# Patient Record
Sex: Male | Born: 1975 | Race: Black or African American | Hispanic: No | Marital: Single | Smoking: Never smoker
Health system: Southern US, Community
[De-identification: ages and names within clinical notes are randomized; demographics above are authoritative.]

## PROBLEM LIST (undated history)

## (undated) DIAGNOSIS — B019 Varicella without complication: Secondary | ICD-10-CM

## (undated) HISTORY — DX: Varicella without complication: B01.9

## (undated) HISTORY — PX: OTHER SURGICAL HISTORY: SHX169

---

## 2008-02-02 ENCOUNTER — Encounter: Admission: RE | Admit: 2008-02-02 | Discharge: 2008-02-02 | Payer: Self-pay | Admitting: Family Medicine

## 2010-12-14 ENCOUNTER — Encounter: Payer: Self-pay | Admitting: Family Medicine

## 2012-08-07 ENCOUNTER — Ambulatory Visit: Payer: Self-pay | Admitting: Emergency Medicine

## 2012-08-07 ENCOUNTER — Encounter: Payer: Self-pay | Admitting: Family Medicine

## 2012-08-07 VITALS — BP 118/72 | HR 53 | Temp 98.8°F | Resp 16 | Ht 71.75 in | Wt 162.4 lb

## 2012-08-07 DIAGNOSIS — H60339 Swimmer's ear, unspecified ear: Secondary | ICD-10-CM

## 2012-08-07 MED ORDER — CIPROFLOXACIN-HYDROCORTISONE 0.2-1 % OT SUSP: 3.0000 [drp] | Freq: Two times a day (BID) | OTIC | Status: DC

## 2012-08-07 MED ORDER — OFLOXACIN 0.3 % OT SOLN: 10.0000 [drp] | Freq: Every day | OTIC | Status: AC

## 2012-08-07 NOTE — Progress Notes (Addendum)
  Date:  08/07/2012   Name:  David Terry   DOB:  1976-04-29   MRN:  [id number] Gender: male Age: 36 y.o.  PCP:  No primary provider on file.    Chief Complaint: Otalgia   History of Present Illness:  David Terry is a 36 y.o. pleasant patient who presents with the following:  Pain in right ear for 3 days.  Thought that it might be wax and did a treatment.  Now his ear is painful and popping a good bit.  No drainage from ear.  No fever or chills, no nasal congestion, sore throat, sinus drainage.  No neurologic symptoms.  No headache, neck pain.  No cough or coryza.  There is no problem list on file for this patient.   No past medical history on file.  No past surgical history on file.  History  Substance Use Topics  . Smoking status: Never Smoker   . Smokeless tobacco: Not on file  . Alcohol Use: Not on file    No family history on file.  No Known Allergies  Medication list has been reviewed and updated.  No outpatient prescriptions prior to visit.    Review of Systems:  As per HPI, otherwise negative.     Physical Examination: Filed Vitals:   08/07/12 1234  BP: 118/72  Pulse: 53  Temp: 98.8 F (37.1 C)  Resp: 16   Filed Vitals:   08/07/12 1234  Height: 5' 11.75" (1.822 m)  Weight: 162 lb 6.4 oz (73.664 kg)   Body mass index is 22.18 kg/(m^2). Ideal Body Weight: Weight in (lb) to have BMI = 25: 182.7    GEN: WDWN, NAD, Non-toxic, Alert & Oriented x 3 HEENT: Atraumatic, Normocephalic.  Ears and Nose: No external deformity.  Left ear normal.  Right external canal closed nearly completely due swelling. EXTR: No clubbing/cyanosis/edema NEURO: Normal gait.  PSYCH: Normally interactive. Conversant. Not depressed or anxious appearing.  Calm demeanor.    Assessment and Plan: Otitis externa cipro hc Follow up as needed. Unable to pass a wick  Carmelina Dane, MD I have reviewed and agree with documentation. Robert P. Merla Riches,  M.D.

## 2013-05-15 ENCOUNTER — Ambulatory Visit (INDEPENDENT_AMBULATORY_CARE_PROVIDER_SITE_OTHER): Payer: No Typology Code available for payment source | Admitting: Family Medicine

## 2013-05-15 ENCOUNTER — Encounter: Payer: Self-pay | Admitting: Family Medicine

## 2013-05-15 VITALS — BP 110/72 | HR 56 | Temp 98.6°F | Ht 72.5 in | Wt 168.6 lb

## 2013-05-15 DIAGNOSIS — Z23 Encounter for immunization: Secondary | ICD-10-CM

## 2013-05-15 DIAGNOSIS — Z0000 Encounter for general adult medical examination without abnormal findings: Secondary | ICD-10-CM

## 2013-05-15 NOTE — Progress Notes (Signed)
  Subjective:    Patient ID: David Terry, male    DOB: 03/13/1976, 37 y.o.   MRN: [id number]  HPI Pt here for cpe and will need to rto for labs.  No complaints.   Review of Systems Review of Systems  Constitutional: Negative for activity change, appetite change and fatigue.  HENT: Negative for hearing loss, congestion, tinnitus and ear discharge.  dentist q31m Eyes: Negative for visual disturbance (see optho--due) Respiratory: Negative for cough, chest tightness and shortness of breath.   Cardiovascular: Negative for chest pain, palpitations and leg swelling.  Gastrointestinal: Negative for abdominal pain, diarrhea, constipation and abdominal distention.  Genitourinary: Negative for urgency, frequency, decreased urine volume and difficulty urinating.  Musculoskeletal: Negative for back pain, arthralgias and gait problem.  Skin: Negative for color change, pallor and rash.  Neurological: Negative for dizziness, light-headedness, numbness and headaches.  Hematological: Negative for adenopathy. Does not bruise/bleed easily.  Psychiatric/Behavioral: Negative for suicidal ideas, confusion, sleep disturbance, self-injury, dysphoric mood, decreased concentration and agitation.    Past Medical History  Diagnosis Date  . Chickenpox    History   Social History  . Marital Status: Single    Spouse Name: N/A    Number of Children: N/A  . Years of Education: N/A   Occupational History  . indp contractor    Social History Main Topics  . Smoking status: Never Smoker   . Smokeless tobacco: Never Used  . Alcohol Use: Yes     Comment: Occ  . Drug Use: No  . Sexually Active: Yes -- Male partner(s)   Other Topics Concern  . Not on file   Social History Narrative   Exercise-- run--3x a week        Objective:   Physical Exam BP 110/72  Pulse 56  Temp(Src) 98.6 F (37 C) (Oral)  Ht 6' 0.5" (1.842 m)  Wt 168 lb 9.6 oz (76.476 kg)  BMI 22.54 kg/m2  SpO2 98%    BP 110/72   Pulse 56  Temp(Src) 98.6 F (37 C) (Oral)  Ht 6' 0.5" (1.842 m)  Wt 168 lb 9.6 oz (76.476 kg)  BMI 22.54 kg/m2  SpO2 98% General appearance: alert, cooperative, appears stated age and no distress Head: Normocephalic, without obvious abnormality, atraumatic Eyes: conjunctivae/corneas clear. PERRL, EOM's intact. Fundi benign. Ears: normal TM's and external ear canals both ears Nose: Nares normal. Septum midline. Mucosa normal. No drainage or sinus tenderness. Throat: lips, mucosa, and tongue normal; teeth and gums normal Neck: no adenopathy, no carotid bruit, no JVD, supple, symmetrical, trachea midline and thyroid not enlarged, symmetric, no tenderness/mass/nodules Back: symmetric, no curvature. ROM normal. No CVA tenderness. Lungs: clear to auscultation bilaterally Chest wall: no tenderness Heart: regular rate and rhythm, S1, S2 normal, no murmur, click, rub or gallop Abdomen: soft, non-tender; bowel sounds normal; no masses,  no organomegaly Male genitalia: normal Rectal: deferred Extremities: extremities normal, atraumatic, no cyanosis or edema Pulses: 2+ and symmetric Skin: Skin color, texture, turgor normal. No rashes or lesions Lymph nodes: Cervical, supraclavicular, and axillary nodes normal. Neurologic: Alert and oriented X 3, normal strength and tone. Normal symmetric reflexes. Normal coordination and gait Assessment & Plan:  cpe --- ghm utd             Check labs             Pt requesting STD check be done----he is not complaining of any signs or symptoms

## 2013-05-15 NOTE — Patient Instructions (Signed)
Preventive Care for Adults, Male  A healthy lifestyle and preventive care can promote health and wellness. Preventive health guidelines for men include the following key practices:  · A routine yearly physical is a good way to check with your caregiver about your health and preventative screening. It is a chance to share any concerns and updates on your health, and to receive a thorough exam.  · Visit your dentist for a routine exam and preventative care every 6 months. Brush your teeth twice a day and floss once a day. Good oral hygiene prevents tooth decay and gum disease.  · The frequency of eye exams is based on your age, health, family medical history, use of contact lenses, and other factors. Follow your caregiver's recommendations for frequency of eye exams.  · Eat a healthy diet. Foods like vegetables, fruits, whole grains, low-fat dairy products, and lean protein foods contain the nutrients you need without too many calories. Decrease your intake of foods high in solid fats, added sugars, and salt. Eat the right amount of calories for you. Get information about a proper diet from your caregiver, if necessary.  · Regular physical exercise is one of the most important things you can do for your health. Most adults should get at least 150 minutes of moderate-intensity exercise (any activity that increases your heart rate and causes you to sweat) each week. In addition, most adults need muscle-strengthening exercises on 2 or more days a week.  · Maintain a healthy weight. The body mass index (BMI) is a screening tool to identify possible weight problems. It provides an estimate of body fat based on height and weight. Your caregiver can help determine your BMI, and can help you achieve or maintain a healthy weight. For adults 20 years and older:  · A BMI below 18.5 is considered underweight.  · A BMI of 18.5 to 24.9 is normal.  · A BMI of 25 to 29.9 is considered overweight.  · A BMI of 30 and above is  considered obese.  · Maintain normal blood lipids and cholesterol levels by exercising and minimizing your intake of saturated fat. Eat a balanced diet with plenty of fruit and vegetables. Blood tests for lipids and cholesterol should begin at age 20 and be repeated every 5 years. If your lipid or cholesterol levels are high, you are over 50, or you are a high risk for heart disease, you may need your cholesterol levels checked more frequently. Ongoing high lipid and cholesterol levels should be treated with medicines if diet and exercise are not effective.  · If you smoke, find out from your caregiver how to quit. If you do not use tobacco, do not start.  · If you choose to drink alcohol, do not exceed 2 drinks per day. One drink is considered to be 12 ounces (355 mL) of beer, 5 ounces (148 mL) of wine, or 1.5 ounces (44 mL) of liquor.  · Avoid use of street drugs. Do not share needles with anyone. Ask for help if you need support or instructions about stopping the use of drugs.  · High blood pressure causes heart disease and increases the risk of stroke. Your blood pressure should be checked at least every 1 to 2 years. Ongoing high blood pressure should be treated with medicines, if weight loss and exercise are not effective.  · If you are 45 to 37 years old, ask your caregiver if you should take aspirin to prevent heart disease.  · Diabetes screening involves taking   a blood sample to check your fasting blood sugar level. This should be done once every 3 years, after age 45, if you are within normal weight and without risk factors for diabetes. Testing should be considered at a younger age or be carried out more frequently if you are overweight and have at least 1 risk factor for diabetes.  · Colorectal cancer can be detected and often prevented. Most routine colorectal cancer screening begins at the age of 50 and continues through age 75. However, your caregiver may recommend screening at an earlier age if you  have risk factors for colon cancer. On a yearly basis, your caregiver may provide home test kits to check for hidden blood in the stool. Use of a small camera at the end of a tube, to directly examine the colon (sigmoidoscopy or colonoscopy), can detect the earliest forms of colorectal cancer. Talk to your caregiver about this at age 50, when routine screening begins.  Direct examination of the colon should be repeated every 5 to 10 years through age 75, unless early forms of pre-cancerous polyps or small growths are found.  · Hepatitis C blood testing is recommended for all people born from 1945 through 1965 and any individual with known risks for hepatitis C.  · Practice safe sex. Use condoms and avoid high-risk sexual practices to reduce the spread of sexually transmitted infections (STIs). STIs include gonorrhea, chlamydia, syphilis, trichomonas, herpes, HPV, and human immunodeficiency virus (HIV). Herpes, HIV, and HPV are viral illnesses that have no cure. They can result in disability, cancer, and death.  · A one-time screening for abdominal aortic aneurysm (AAA) and surgical repair of large AAAs by sound wave imaging (ultrasonography) is recommended for ages 65 to 75 years who are current or former smokers.  · Healthy men should no longer receive prostate-specific antigen (PSA) blood tests as part of routine cancer screening. Consult with your caregiver about prostate cancer screening.  · Testicular cancer screening is not recommended for adult males who have no symptoms. Screening includes self-exam, caregiver exam, and other screening tests. Consult with your caregiver about any symptoms you have or any concerns you have about testicular cancer.  · Use sunscreen with skin protection factor (SPF) of 30 or more. Apply sunscreen liberally and repeatedly throughout the day. You should seek shade when your shadow is shorter than you. Protect yourself by wearing long sleeves, pants, a wide-brimmed hat, and  sunglasses year round, whenever you are outdoors.  · Once a month, do a whole body skin exam, using a mirror to look at the skin on your back. Notify your caregiver of new moles, moles that have irregular borders, moles that are larger than a pencil eraser, or moles that have changed in shape or color.  · Stay current with required immunizations.  · Influenza. You need a dose every fall (or winter). The composition of the flu vaccine changes each year, so being vaccinated once is not enough.  · Pneumococcal polysaccharide. You need 1 to 2 doses if you smoke cigarettes or if you have certain chronic medical conditions. You need 1 dose at age 65 (or older) if you have never been vaccinated.  · Tetanus, diphtheria, pertussis (Tdap, Td). Get 1 dose of Tdap vaccine if you are younger than age 65 years, are over 65 and have contact with an infant, are a healthcare worker, or simply want to be protected from whooping cough. After that, you need a Td booster dose every 10 years. Consult your   caregiver if you have not had at least 3 tetanus and diphtheria-containing shots sometime in your life or have a deep or dirty wound.  · HPV. This vaccine is recommended for males 13 through 37 years of age. This vaccine may be given to men 22 through 37 years of age who have not completed the 3 dose series. It is recommended for men through age 26 who have sex with men or whose immune system is weakened because of HIV infection, other illness, or medications. The vaccine is given in 3 doses over 6 months.  · Measles, mumps, rubella (MMR). You need at least 1 dose of MMR if you were born in 1957 or later. You may also need a 2nd dose.  · Meningococcal. If you are age 19 to 21 years and a first-year college student living in a residence hall, or have one of several medical conditions, you need to get vaccinated against meningococcal disease. You may also need additional booster doses.  · Zoster (shingles). If you are age 60 years or  older, you should get this vaccine.  · Varicella (chickenpox). If you have never had chickenpox or you were vaccinated but received only 1 dose, talk to your caregiver to find out if you need this vaccine.  · Hepatitis A. You need this vaccine if you have a specific risk factor for hepatitis A virus infection, or you simply wish to be protected from this disease. The vaccine is usually given as 2 doses, 6 to 18 months apart.  · Hepatitis B. You need this vaccine if you have a specific risk factor for hepatitis B virus infection or you simply wish to be protected from this disease. The vaccine is given in 3 doses, usually over 6 months.  Preventative Service / Frequency  Ages 19 to 39  · Blood pressure check.** / Every 1 to 2 years.  · Lipid and cholesterol check.** / Every 5 years beginning at age 20.  · Hepatitis C blood test.** / For any individual with known risks for hepatitis C.  · Skin self-exam. / Monthly.  · Influenza immunization.** / Every year.  · Pneumococcal polysaccharide immunization.** / 1 to 2 doses if you smoke cigarettes or if you have certain chronic medical conditions.  · Tetanus, diphtheria, pertussis (Tdap,Td) immunization. / A one-time dose of Tdap vaccine. After that, you need a Td booster dose every 10 years.  · HPV immunization. / 3 doses over 6 months, if 26 and younger.  · Measles, mumps, rubella (MMR) immunization. / You need at least 1 dose of MMR if you were born in 1957 or later. You may also need a 2nd dose.  · Meningococcal immunization. / 1 dose if you are age 19 to 21 years and a first-year college student living in a residence hall, or have one of several medical conditions, you need to get vaccinated against meningococcal disease. You may also need additional booster doses.  · Varicella immunization.** / Consult your caregiver.  · Hepatitis A immunization.** / Consult your caregiver. 2 doses, 6 to 18 months apart.  · Hepatitis B immunization.** / Consult your caregiver. 3 doses  usually over 6 months.  Ages 40 to 64  · Blood pressure check.** / Every 1 to 2 years.  · Lipid and cholesterol check.** / Every 5 years beginning at age 20.  · Fecal occult blood test (FOBT) of stool. / Every year beginning at age 50 and continuing until age 75. You may not have   to do this test if you get colonoscopy every 10 years.  · Flexible sigmoidoscopy** or colonoscopy.** / Every 5 years for a flexible sigmoidoscopy or every 10 years for a colonoscopy beginning at age 50 and continuing until age 75.  · Hepatitis C blood test.** / For all people born from 1945 through 1965 and any individual with known risks for hepatitis C.  · Skin self-exam. / Monthly.  · Influenza immunization.** / Every year.  · Pneumococcal polysaccharide immunization.** / 1 to 2 doses if you smoke cigarettes or if you have certain chronic medical conditions.  · Tetanus, diphtheria, pertussis (Tdap/Td) immunization.** / A one-time dose of Tdap vaccine. After that, you need a Td booster dose every 10 years.  · Measles, mumps, rubella (MMR) immunization.  / You need at least 1 dose of MMR if you were born in 1957 or later. You may also need a 2nd dose.  · Varicella immunization.**/ Consult your caregiver.  · Meningococcal immunization.** / Consult your caregiver.  · Hepatitis A immunization.** / Consult your caregiver. 2 doses, 6 to 18 months apart.  · Hepatitis B immunization.** / Consult your caregiver. 3 doses, usually over 6 months.  Ages 65 and over  · Blood pressure check.** / Every 1 to 2 years.  · Lipid and cholesterol check.**/ Every 5 years beginning at age 20.  · Fecal occult blood test (FOBT) of stool. / Every year beginning at age 50 and continuing until age 75. You may not have to do this test if you get colonoscopy every 10 years.  · Flexible sigmoidoscopy** or colonoscopy.** / Every 5 years for a flexible sigmoidoscopy or every 10 years for a colonoscopy beginning at age 50 and continuing until age 75.  · Hepatitis C blood  test.** / For all people born from 1945 through 1965 and any individual with known risks for hepatitis C.  · Abdominal aortic aneurysm (AAA) screening.** / A one-time screening for ages 65 to 75 years who are current or former smokers.  · Skin self-exam. / Monthly.  · Influenza immunization.** / Every year.  · Pneumococcal polysaccharide immunization.** / 1 dose at age 65 (or older) if you have never been vaccinated.  · Tetanus, diphtheria, pertussis (Tdap, Td) immunization. / A one-time dose of Tdap vaccine if you are over 65 and have contact with an infant, are a healthcare worker, or simply want to be protected from whooping cough. After that, you need a Td booster dose every 10 years.  · Varicella immunization. ** / Consult your caregiver.  · Meningococcal immunization.** / Consult your caregiver.  · Hepatitis A immunization. ** / Consult your caregiver. 2 doses, 6 to 18 months apart.  · Hepatitis B immunization.** / Check with your caregiver. 3 doses, usually over 6 months.  **Family history and personal history of risk and conditions may change your caregiver's recommendations.  Document Released: 01/05/2002 Document Revised: 02/01/2012 Document Reviewed: 04/06/2011  [employer]® Patient Information ©2014 [employer].

## 2013-05-16 ENCOUNTER — Other Ambulatory Visit (INDEPENDENT_AMBULATORY_CARE_PROVIDER_SITE_OTHER): Payer: No Typology Code available for payment source

## 2013-05-16 DIAGNOSIS — Z0000 Encounter for general adult medical examination without abnormal findings: Secondary | ICD-10-CM

## 2013-05-16 DIAGNOSIS — R809 Proteinuria, unspecified: Secondary | ICD-10-CM

## 2013-05-16 LAB — BASIC METABOLIC PANEL
CO2: 28 mEq/L (ref 19–32)
Chloride: 104 mEq/L (ref 96–112)
Creatinine, Ser: 1 mg/dL (ref 0.4–1.5)
Potassium: 3.7 mEq/L (ref 3.5–5.1)

## 2013-05-16 LAB — LIPID PANEL
LDL Cholesterol: 108 mg/dL — ABNORMAL HIGH (ref 0–99)
Total CHOL/HDL Ratio: 3
Triglycerides: 74 mg/dL (ref 0.0–149.0)
VLDL: 14.8 mg/dL (ref 0.0–40.0)

## 2013-05-16 LAB — CBC WITH DIFFERENTIAL/PLATELET
Basophils Relative: 0.3 % (ref 0.0–3.0)
Eosinophils Absolute: 0.1 10*3/uL (ref 0.0–0.7)
Eosinophils Relative: 1.7 % (ref 0.0–5.0)
HCT: 42.3 % (ref 39.0–52.0)
Hemoglobin: 14.1 g/dL (ref 13.0–17.0)
Lymphs Abs: 2 10*3/uL (ref 0.7–4.0)
MCHC: 33.5 g/dL (ref 30.0–36.0)
MCV: 91.3 fl (ref 78.0–100.0)
Monocytes Absolute: 0.6 10*3/uL (ref 0.1–1.0)
Neutro Abs: 3.4 10*3/uL (ref 1.4–7.7)
Neutrophils Relative %: 55.4 % (ref 43.0–77.0)
RBC: 4.63 Mil/uL (ref 4.22–5.81)
WBC: 6.1 10*3/uL (ref 4.5–10.5)

## 2013-05-16 LAB — HEPATIC FUNCTION PANEL
Albumin: 4.3 g/dL (ref 3.5–5.2)
Alkaline Phosphatase: 65 U/L (ref 39–117)

## 2013-06-01 LAB — PROTEIN, URINE, 24 HOUR: Protein, 24H Urine: 50 mg/d (ref 50–100)

## 2015-02-27 ENCOUNTER — Ambulatory Visit (INDEPENDENT_AMBULATORY_CARE_PROVIDER_SITE_OTHER): Payer: 59

## 2015-02-27 ENCOUNTER — Ambulatory Visit (INDEPENDENT_AMBULATORY_CARE_PROVIDER_SITE_OTHER): Payer: 59 | Admitting: Family Medicine

## 2015-02-27 VITALS — BP 106/76 | HR 57 | Temp 98.6°F | Resp 18 | Ht 72.0 in | Wt 168.2 lb

## 2015-02-27 DIAGNOSIS — R0989 Other specified symptoms and signs involving the circulatory and respiratory systems: Secondary | ICD-10-CM | POA: Diagnosis not present

## 2015-02-27 DIAGNOSIS — R059 Cough, unspecified: Secondary | ICD-10-CM

## 2015-02-27 DIAGNOSIS — R05 Cough: Secondary | ICD-10-CM

## 2015-02-27 DIAGNOSIS — J209 Acute bronchitis, unspecified: Secondary | ICD-10-CM | POA: Diagnosis not present

## 2015-02-27 LAB — POCT CBC
Granulocyte percent: 75.8 %G (ref 37–80)
HCT, POC: 42.8 % — AB (ref 43.5–53.7)
Hemoglobin: 14 g/dL — AB (ref 14.1–18.1)
Lymph, poc: 1.5 (ref 0.6–3.4)
MCH, POC: 28.6 pg (ref 27–31.2)
MCHC: 32.6 g/dL (ref 31.8–35.4)
MCV: 87.8 fL (ref 80–97)
MID (cbc): 0.4 (ref 0–0.9)
MPV: 6.8 fL (ref 0–99.8)
POC Granulocyte: 6 (ref 2–6.9)
POC LYMPH PERCENT: 18.7 % (ref 10–50)
POC MID %: 5.5 % (ref 0–12)
Platelet Count, POC: 145 10*3/uL (ref 142–424)
RBC: 4.88 M/uL (ref 4.69–6.13)
RDW, POC: 13 %
WBC: 7.9 10*3/uL (ref 4.6–10.2)

## 2015-02-27 MED ORDER — BENZONATATE 100 MG PO CAPS: 200.0000 mg | ORAL_CAPSULE | Freq: Two times a day (BID) | ORAL | Status: DC | PRN

## 2015-02-27 MED ORDER — HYDROCODONE-HOMATROPINE 5-1.5 MG/5ML PO SYRP: 5.0000 mL | ORAL_SOLUTION | Freq: Every evening | ORAL | Status: DC | PRN

## 2015-02-27 MED ORDER — AZITHROMYCIN 250 MG PO TABS: ORAL_TABLET | ORAL | Status: DC

## 2015-02-27 NOTE — Progress Notes (Signed)
Chief Complaint:  Chief Complaint  Patient presents with  . Cough    X 4-5 days    HPI: David Terry is a 39 y.o. male who is here for 4-5 day history of productive cough, chest congestion and tightness. He did not have the flu vaccine this should. He did have chills and a fever of 102 yesterday. Has taken Mucinex for it. He wants to make sure he doesn't have anything in his chest. He felt like he had flulike symptoms with muscle aches, soreness. Denies any shortness of breath. Denies wheezing. No history of asthma or allergies. She was he was recently traveling and may have caught it when he was in [place]  Past Medical History  Diagnosis Date  . Chickenpox    Past Surgical History  Procedure Laterality Date  . Undescended testi Right     39 yrs old   History   Social History  . Marital Status: Single    Spouse Name: N/A  . Number of Children: N/A  . Years of Education: N/A   Occupational History  . indp contractor    Social History Main Topics  . Smoking status: Never Smoker   . Smokeless tobacco: Never Used  . Alcohol Use: Yes     Comment: Occ  . Drug Use: No  . Sexual Activity:    Partners: Female   Other Topics Concern  . None   Social History Narrative   Exercise-- run--3x a week   History reviewed. No pertinent family history. No Known Allergies Prior to Admission medications   Not on File     ROS: The patient denies fevers, chills, night sweats, unintentional weight loss, chest pain, palpitations, wheezing, dyspnea on exertion, nausea, vomiting, abdominal pain, dysuria, hematuria, melena, numbness, weakness, or tingling.   All other systems have been reviewed and were otherwise negative with the exception of those mentioned in the HPI and as above.    PHYSICAL EXAM: Filed Vitals:   02/27/15 1719  BP: 106/76  Pulse: 57  Temp: 98.6 F (37 C)  Resp: 18   Filed Vitals:   02/27/15 1719  Height: 6' (1.829 m)  Weight: 168 lb 3.2 oz  (76.295 kg)   Body mass index is 22.81 kg/(m^2).  General: Alert, no acute distress HEENT:  Normocephalic, atraumatic, oropharynx patent. EOMI, PERRLA Erythematous throat, no exudates, TM normal, +/- sinus tenderness, + erythematous/boggy nasal mucosa Cardiovascular:  Regular rate and rhythm, no rubs murmurs or gallops.  No Carotid bruits, radial pulse intact. No pedal edema.  Respiratory: Clear to auscultation bilaterally.  No wheezes, rales, or rhonchi.  No cyanosis, no use of accessory musculature GI: No organomegaly, abdomen is soft and non-tender, positive bowel sounds.  No masses. Skin: No rashes. Neurologic: Facial musculature symmetric. Psychiatric: Patient is appropriate throughout our interaction. Lymphatic: No cervical lymphadenopathy Musculoskeletal: Gait intact.   LABS: Results for orders placed or performed in visit on 02/27/15  POCT CBC  Result Value Ref Range   WBC 7.9 4.6 - 10.2 K/uL   Lymph, poc 1.5 0.6 - 3.4   POC LYMPH PERCENT 18.7 10 - 50 %L   MID (cbc) 0.4 0 - 0.9   POC MID % 5.5 0 - 12 %M   POC Granulocyte 6.0 2 - 6.9   Granulocyte percent 75.8 37 - 80 %G   RBC 4.88 4.69 - 6.13 M/uL   Hemoglobin 14.0 (A) 14.1 - 18.1 g/dL   HCT, POC 16.1 (A) 09.6 -  53.7 %   MCV 87.8 80 - 97 fL   MCH, POC 28.6 27 - 31.2 pg   MCHC 32.6 31.8 - 35.4 g/dL   RDW, POC 01.013.0 %   Platelet Count, POC 145 142 - 424 K/uL   MPV 6.8 0 - 99.8 fL     EKG/XRAY:   [facility] Neg xray   ASSESSMENT/PLAN: Encounter Diagnoses  Name Primary?  . Cough Yes  . Chest congestion   . Acute bronchitis, unspecified organism    We will treat for cough with Tessalon Perles and also Hycodan when necessary If he is not improved and he can take the Z-Pak for acute bronchitis. He declines any inhalers at this time. Follow up as needed  Gross sideeffects, risk and benefits, and alternatives of medications d/w patient. Patient is aware that all medications have  potential sideeffects and we are unable to predict every sideeffect or drug-drug interaction that may occur.  Hamilton CapriLE, [name], DO 02/27/2015 7:27 PM

## 2015-02-27 NOTE — Patient Instructions (Addendum)
Acute Bronchitis Bronchitis is inflammation of the airways that extend from the windpipe into the lungs (bronchi). The inflammation often causes mucus to develop. This leads to a cough, which is the most common symptom of bronchitis.  In acute bronchitis, the condition usually develops suddenly and goes away over time, usually in a couple weeks. Smoking, allergies, and asthma can make bronchitis worse. Repeated episodes of bronchitis may cause further lung problems.  CAUSES Acute bronchitis is most often caused by the same virus that causes a cold. The virus can spread from person to person (contagious) through coughing, sneezing, and touching contaminated objects. SIGNS AND SYMPTOMS   Cough.   Fever.   Coughing up mucus.   Body aches.   Chest congestion.   Chills.   Shortness of breath.   Sore throat.  DIAGNOSIS  Acute bronchitis is usually diagnosed through a physical exam. Your health care provider will also ask you questions about your medical history. Tests, such as chest X-rays, are sometimes done to rule out other conditions.  TREATMENT  Acute bronchitis usually goes away in a couple weeks. Oftentimes, no medical treatment is necessary. Medicines are sometimes given for relief of fever or cough. Antibiotic medicines are usually not needed but may be prescribed in certain situations. In some cases, an inhaler may be recommended to help reduce shortness of breath and control the cough. A cool mist vaporizer may also be used to help thin bronchial secretions and make it easier to clear the chest.  HOME CARE INSTRUCTIONS  Get plenty of rest.   Drink enough fluids to keep your urine clear or pale yellow (unless you have a medical condition that requires fluid restriction). Increasing fluids may help thin your respiratory secretions (sputum) and reduce chest congestion, and it will prevent dehydration.   Take medicines only as directed by your health care provider.  If  you were prescribed an antibiotic medicine, finish it all even if you start to feel better.  Avoid smoking and secondhand smoke. Exposure to cigarette smoke or irritating chemicals will make bronchitis worse. If you are a smoker, consider using nicotine gum or skin patches to help control withdrawal symptoms. Quitting smoking will help your lungs heal faster.   Reduce the chances of another bout of acute bronchitis by washing your hands frequently, avoiding people with cold symptoms, and trying not to touch your hands to your mouth, nose, or eyes.   Keep all follow-up visits as directed by your health care provider.  SEEK MEDICAL CARE IF: Your symptoms do not improve after 1 week of treatment.  SEEK IMMEDIATE MEDICAL CARE IF:  You develop an increased fever or chills.   You have chest pain.   You have severe shortness of breath.  You have bloody sputum.   You develop dehydration.  You faint or repeatedly feel like you are going to pass out.  You develop repeated vomiting.  You develop a severe headache. MAKE SURE YOU:   Understand these instructions.  Will watch your condition.  Will get help right away if you are not doing well or get worse. Document Released: 12/17/2004 Document Revised: 03/26/2014 Document Reviewed: 05/02/2013 [facility] Patient Information 2015 [place], [place]. This information is not intended to replace advice given to you by your health care provider. Make sure you discuss any questions you have with your health care provider.   Nasacort nasal spray OTC once daily

## 2016-01-18 ENCOUNTER — Ambulatory Visit (INDEPENDENT_AMBULATORY_CARE_PROVIDER_SITE_OTHER): Payer: BLUE CROSS/BLUE SHIELD | Admitting: Family Medicine

## 2016-01-18 DIAGNOSIS — M6248 Contracture of muscle, other site: Secondary | ICD-10-CM

## 2016-01-18 DIAGNOSIS — S134XXA Sprain of ligaments of cervical spine, initial encounter: Secondary | ICD-10-CM

## 2016-01-18 DIAGNOSIS — M62838 Other muscle spasm: Secondary | ICD-10-CM
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
## 2016-01-18 NOTE — Patient Instructions (Signed)
Cervical Sprain  A cervical sprain is an injury in the neck in which the strong, fibrous tissues (ligaments) that connect your neck bones stretch or tear. Cervical sprains can range from mild to severe. Severe cervical sprains can cause the neck vertebrae to be unstable. This can lead to damage of the spinal cord and can result in serious nervous system problems. The amount of time it takes for a cervical sprain to get better depends on the cause and extent of the injury. Most cervical sprains heal in 1 to 3 weeks.  CAUSES   Severe cervical sprains may be caused by:    Contact sport injuries (such as from football, rugby, wrestling, hockey, auto racing, gymnastics, diving, martial arts, or boxing).    Motor vehicle collisions.    Whiplash injuries. This is an injury from a sudden forward and backward whipping movement of the head and neck.   Falls.   Mild cervical sprains may be caused by:    Being in an awkward position, such as while cradling a telephone between your ear and shoulder.    Sitting in a chair that does not offer proper support.    Working at a poorly designed computer station.    Looking up or down for long periods of time.   SYMPTOMS    Pain, soreness, stiffness, or a burning sensation in the front, back, or sides of the neck. This discomfort may develop immediately after the injury or slowly, 24 hours or more after the injury.    Pain or tenderness directly in the middle of the back of the neck.    Shoulder or upper back pain.    Limited ability to move the neck.    Headache.    Dizziness.    Weakness, numbness, or tingling in the hands or arms.    Muscle spasms.    Difficulty swallowing or chewing.    Tenderness and swelling of the neck.   DIAGNOSIS   Most of the time your health care provider can diagnose a cervical sprain by taking your history and doing a physical exam. Your health care provider will ask about previous neck injuries and any known neck  problems, such as arthritis in the neck. X-rays may be taken to find out if there are any other problems, such as with the bones of the neck. Other tests, such as a CT scan or MRI, may also be needed.   TREATMENT   Treatment depends on the severity of the cervical sprain. Mild sprains can be treated with rest, keeping the neck in place (immobilization), and pain medicines. Severe cervical sprains are immediately immobilized. Further treatment is done to help with pain, muscle spasms, and other symptoms and may include:   Medicines, such as pain relievers, numbing medicines, or muscle relaxants.    Physical therapy. This may involve stretching exercises, strengthening exercises, and posture training. Exercises and improved posture can help stabilize the neck, strengthen muscles, and help stop symptoms from returning.   HOME CARE INSTRUCTIONS    Put ice on the injured area.     Put ice in a plastic bag.     Place a towel between your skin and the bag.     Leave the ice on for 15-20 minutes, 3-4 times a day.    If your injury was severe, you may have been given a cervical collar to wear. A cervical collar is a two-piece collar designed to keep your neck from moving while it heals.      Do not remove the collar unless instructed by your health care provider.    If you have long hair, keep it outside of the collar.    Ask your health care provider before making any adjustments to your collar. Minor adjustments may be required over time to improve comfort and reduce pressure on your chin or on the back of your head.    Ifyou are allowed to remove the collar for cleaning or bathing, follow your health care provider's instructions on how to do so safely.    Keep your collar clean by wiping it with mild soap and water and drying it completely. If the collar you have been given includes removable pads, remove them every 1-2 days and hand wash them with soap and water. Allow them to air dry. They should be completely  dry before you wear them in the collar.    If you are allowed to remove the collar for cleaning and bathing, wash and dry the skin of your neck. Check your skin for irritation or sores. If you see any, tell your health care provider.    Do not drive while wearing the collar.    Only take over-the-counter or prescription medicines for pain, discomfort, or fever as directed by your health care provider.    Keep all follow-up appointments as directed by your health care provider.    Keep all physical therapy appointments as directed by your health care provider.    Make any needed adjustments to your workstation to promote good posture.    Avoid positions and activities that make your symptoms worse.    Warm up and stretch before being active to help prevent problems.   SEEK MEDICAL CARE IF:    Your pain is not controlled with medicine.    You are unable to decrease your pain medicine over time as planned.    Your activity level is not improving as expected.   SEEK IMMEDIATE MEDICAL CARE IF:    You develop any bleeding.   You develop stomach upset.   You have signs of an allergic reaction to your medicine.    Your symptoms get worse.    You develop new, unexplained symptoms.    You have numbness, tingling, weakness, or paralysis in any part of your body.   MAKE SURE YOU:    Understand these instructions.   Will watch your condition.   Will get help right away if you are not doing well or get worse.     This information is not intended to replace advice given to you by your health care provider. Make sure you discuss any questions you have with your health care provider.     Document Released: 09/06/2007 Document Revised: 11/14/2013 Document Reviewed: 05/17/2013  [employer] Interactive Patient Education 2016 [employer]

## 2017-03-04 IMAGING — CR DG CHEST 2V
2 series · 2 of 2 positions shown · non-contrast
Comparison: None.

CLINICAL DATA: Cough and congestion.  Chills and chest tightness.

EXAM:
CHEST  2 VIEW

[PA]
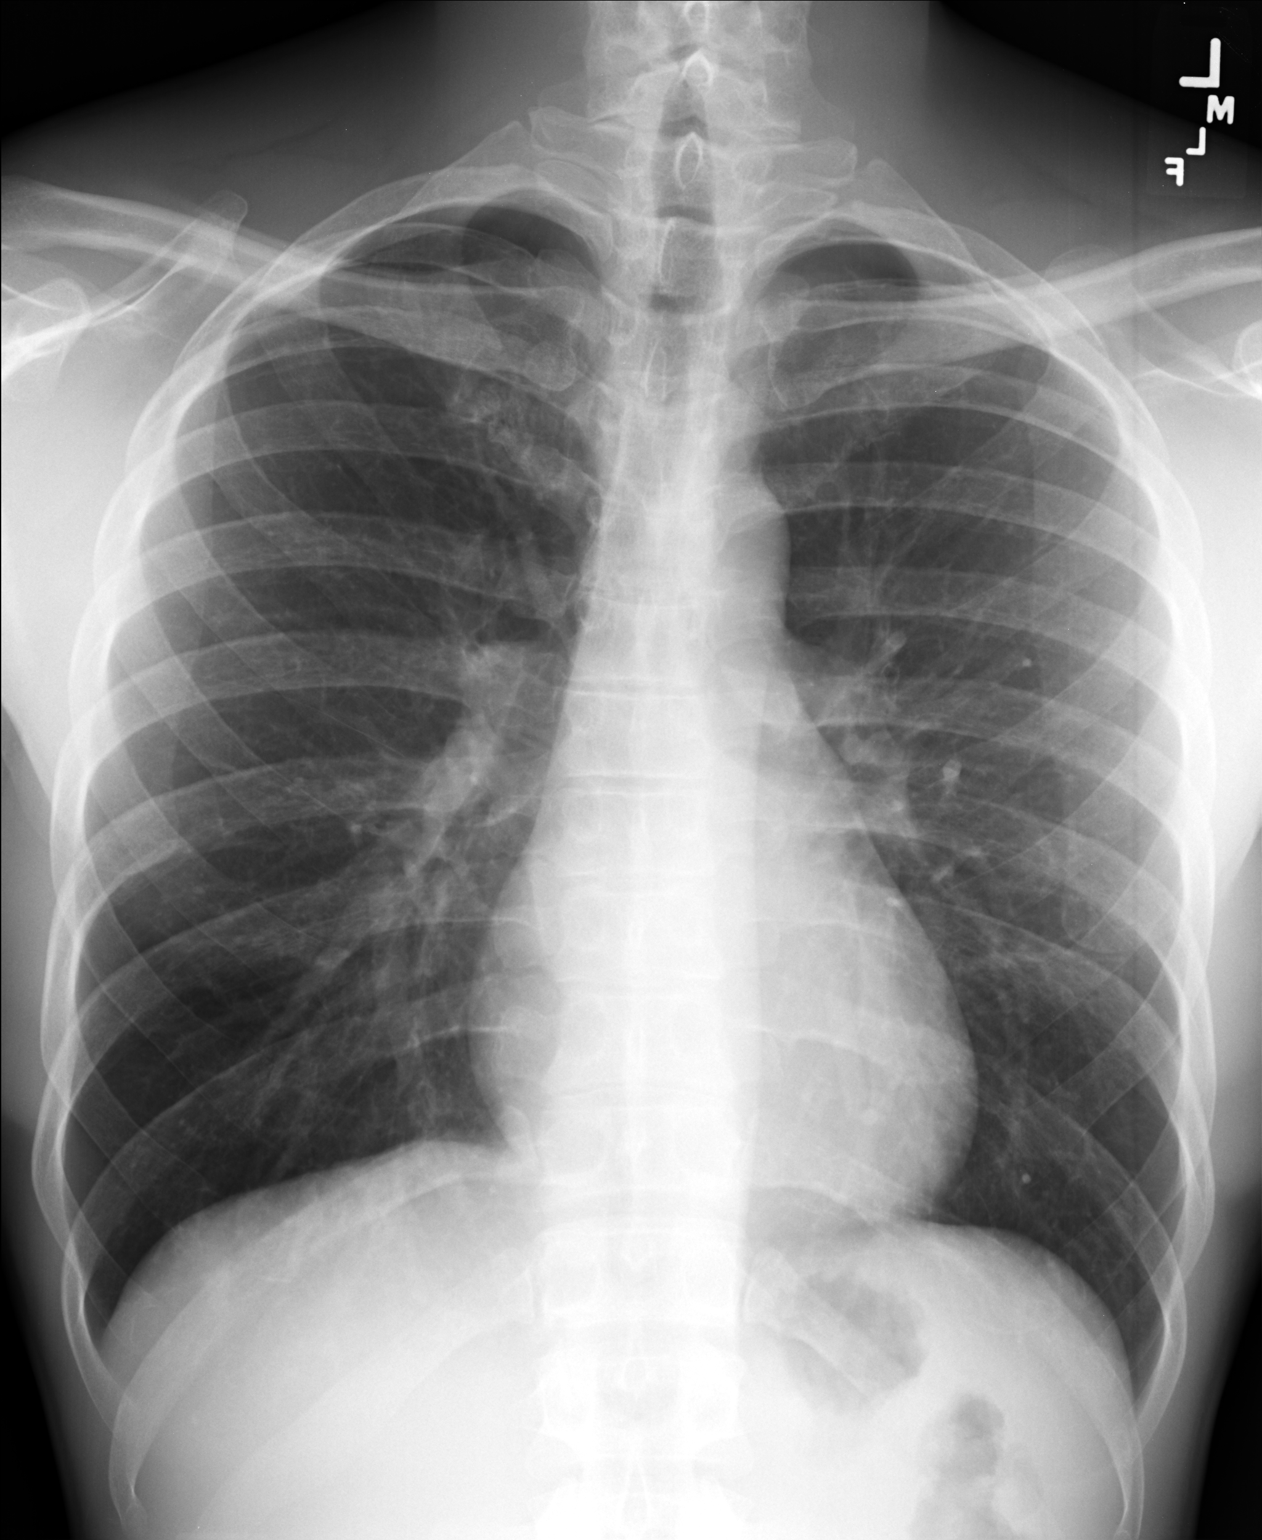

[lateral]
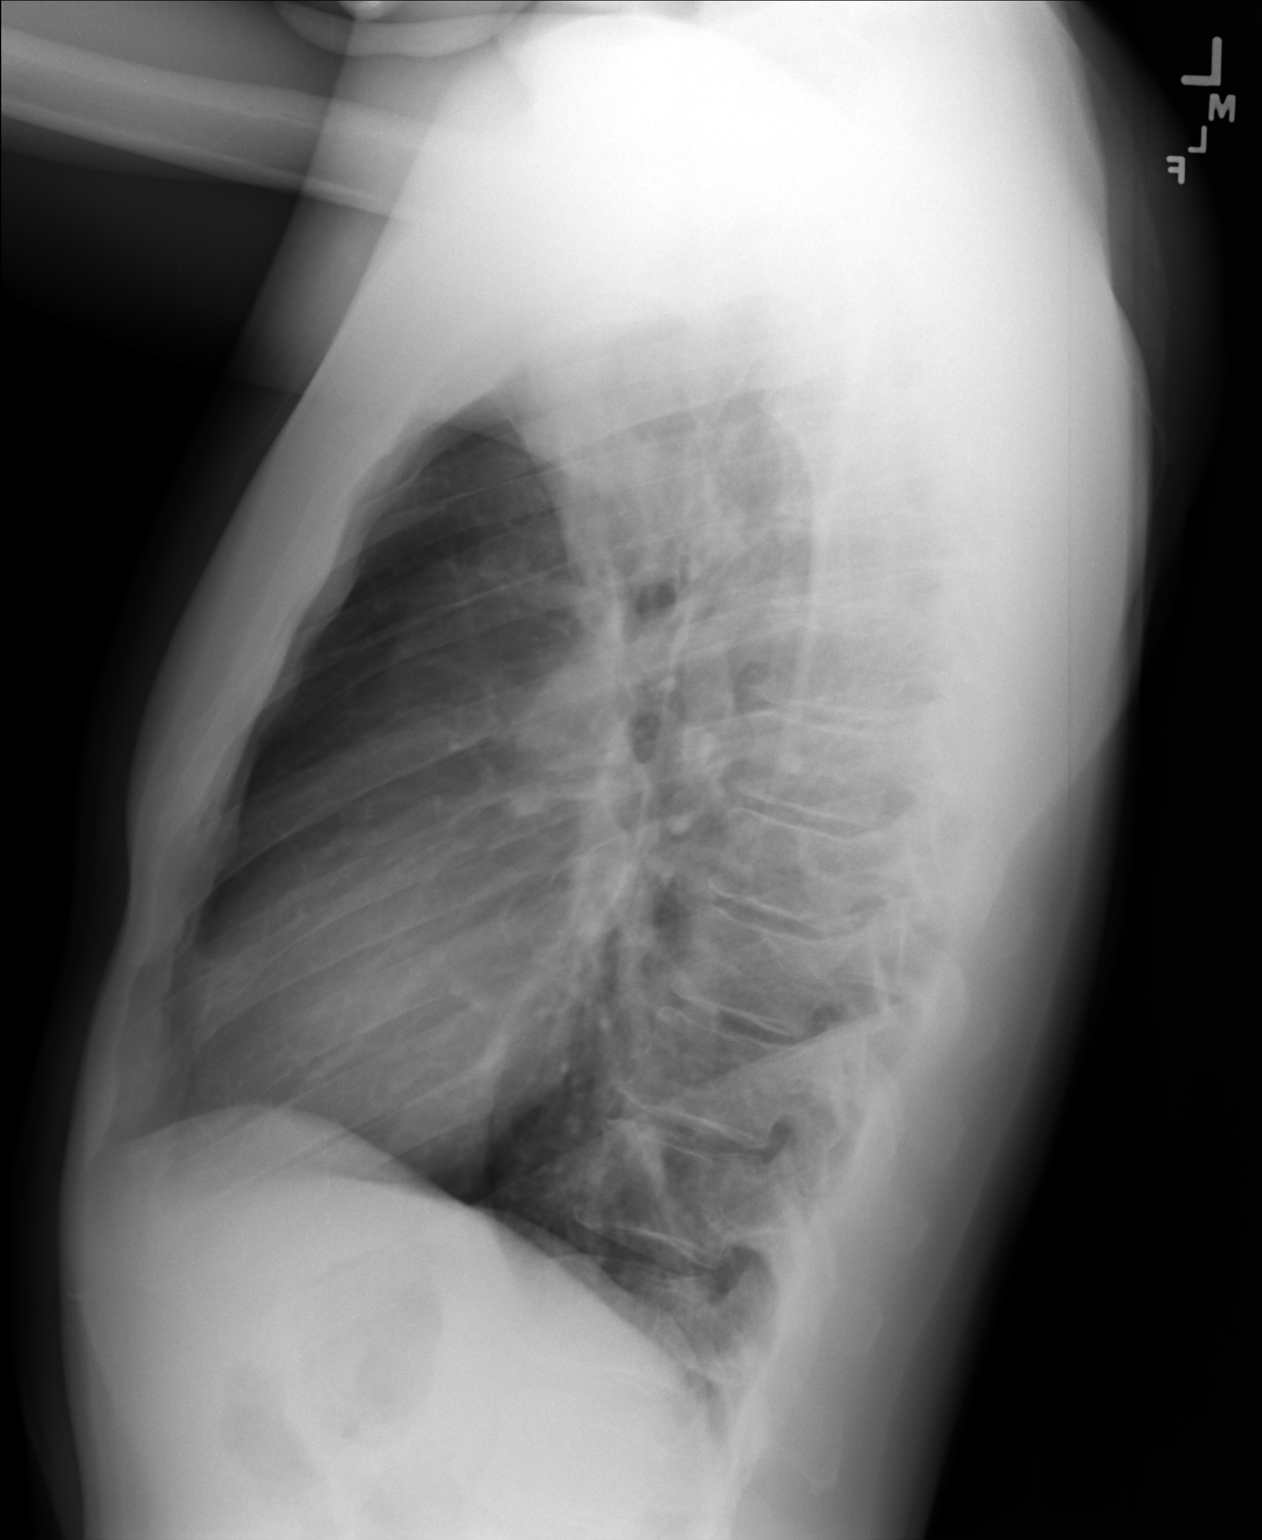

[2 of 2 positions shown; findings below may reference images not displayed]

FINDINGS: Normal heart size and mediastinal contours. No acute infiltrate or
edema. No effusion or pneumothorax. No acute osseous findings.
IMPRESSION: Normal chest.
